# Patient Record
Sex: Female | Born: 1960
Health system: Southern US, Community
[De-identification: ages and names within clinical notes are randomized; demographics above are authoritative.]

## PROBLEM LIST (undated history)

## (undated) DIAGNOSIS — E785 Hyperlipidemia, unspecified: Secondary | ICD-10-CM

## (undated) DIAGNOSIS — Z793 Long term (current) use of hormonal contraceptives: Secondary | ICD-10-CM

## (undated) HISTORY — DX: Hyperlipidemia, unspecified: E78.5

## (undated) HISTORY — PX: CHOLECYSTECTOMY: SHX55

## (undated) HISTORY — DX: Long term (current) use of hormonal contraceptives: Z79.3

---

## 1999-03-19 ENCOUNTER — Encounter: Payer: Self-pay | Admitting: Emergency Medicine

## 1999-03-19 ENCOUNTER — Emergency Department (HOSPITAL_COMMUNITY): Admission: EM | Admit: 1999-03-19 | Discharge: 1999-03-19 | Payer: Self-pay | Admitting: Emergency Medicine

## 1999-04-01 ENCOUNTER — Ambulatory Visit (HOSPITAL_COMMUNITY): Admission: RE | Admit: 1999-04-01 | Discharge: 1999-04-02 | Payer: Self-pay | Admitting: *Deleted

## 1999-12-30 ENCOUNTER — Encounter: Admission: RE | Admit: 1999-12-30 | Discharge: 1999-12-30 | Payer: Self-pay | Admitting: Family Medicine

## 1999-12-30 ENCOUNTER — Encounter: Payer: Self-pay | Admitting: Family Medicine

## 2000-02-19 ENCOUNTER — Emergency Department (HOSPITAL_COMMUNITY): Admission: EM | Admit: 2000-02-19 | Discharge: 2000-02-20 | Payer: Self-pay | Admitting: Emergency Medicine

## 2000-02-20 ENCOUNTER — Encounter: Payer: Self-pay | Admitting: Emergency Medicine

## 2000-06-16 ENCOUNTER — Ambulatory Visit (HOSPITAL_COMMUNITY): Admission: RE | Admit: 2000-06-16 | Discharge: 2000-06-16 | Payer: Self-pay | Admitting: Gastroenterology

## 2000-06-16 ENCOUNTER — Encounter: Payer: Self-pay | Admitting: Gastroenterology

## 2000-07-14 ENCOUNTER — Ambulatory Visit (HOSPITAL_COMMUNITY): Admission: RE | Admit: 2000-07-14 | Discharge: 2000-07-14 | Payer: Self-pay | Admitting: Gastroenterology

## 2000-07-20 ENCOUNTER — Ambulatory Visit (HOSPITAL_COMMUNITY): Admission: RE | Admit: 2000-07-20 | Discharge: 2000-07-20 | Payer: Self-pay | Admitting: Gastroenterology

## 2003-03-05 ENCOUNTER — Encounter: Admission: RE | Admit: 2003-03-05 | Discharge: 2003-03-05 | Payer: Self-pay | Admitting: Family Medicine

## 2003-03-09 ENCOUNTER — Ambulatory Visit: Admission: RE | Admit: 2003-03-09 | Discharge: 2003-03-09 | Payer: Self-pay | Admitting: Family Medicine

## 2005-02-27 IMAGING — CR DG HIP (WITH OR WITHOUT PELVIS) 2-3V*L*
3 series · 3 of 3 positions shown · non-contrast
Comparison: none

CLINICAL DATA: Left hip pain, no injury.
 LEFT HIP, COMPLETE
 AP and lateral view of the left hip and an AP view of the pelvis show no evidence of fracture, dislocation, arthritis, or other abnormality associated with the left hip or the soft tissues.
 There is noted transitional vertebral body at the L5 level with somewhat asymmetric false joints at the L5 level.  The sacroiliac joints otherwise appear normal.  There may be a mild lumbar scoliosis.
 IMPRESSION
 Normal left hip.
 Transitional vertebral body with asymmetrical false joints, L5.

[view not recorded (1 of 3)]
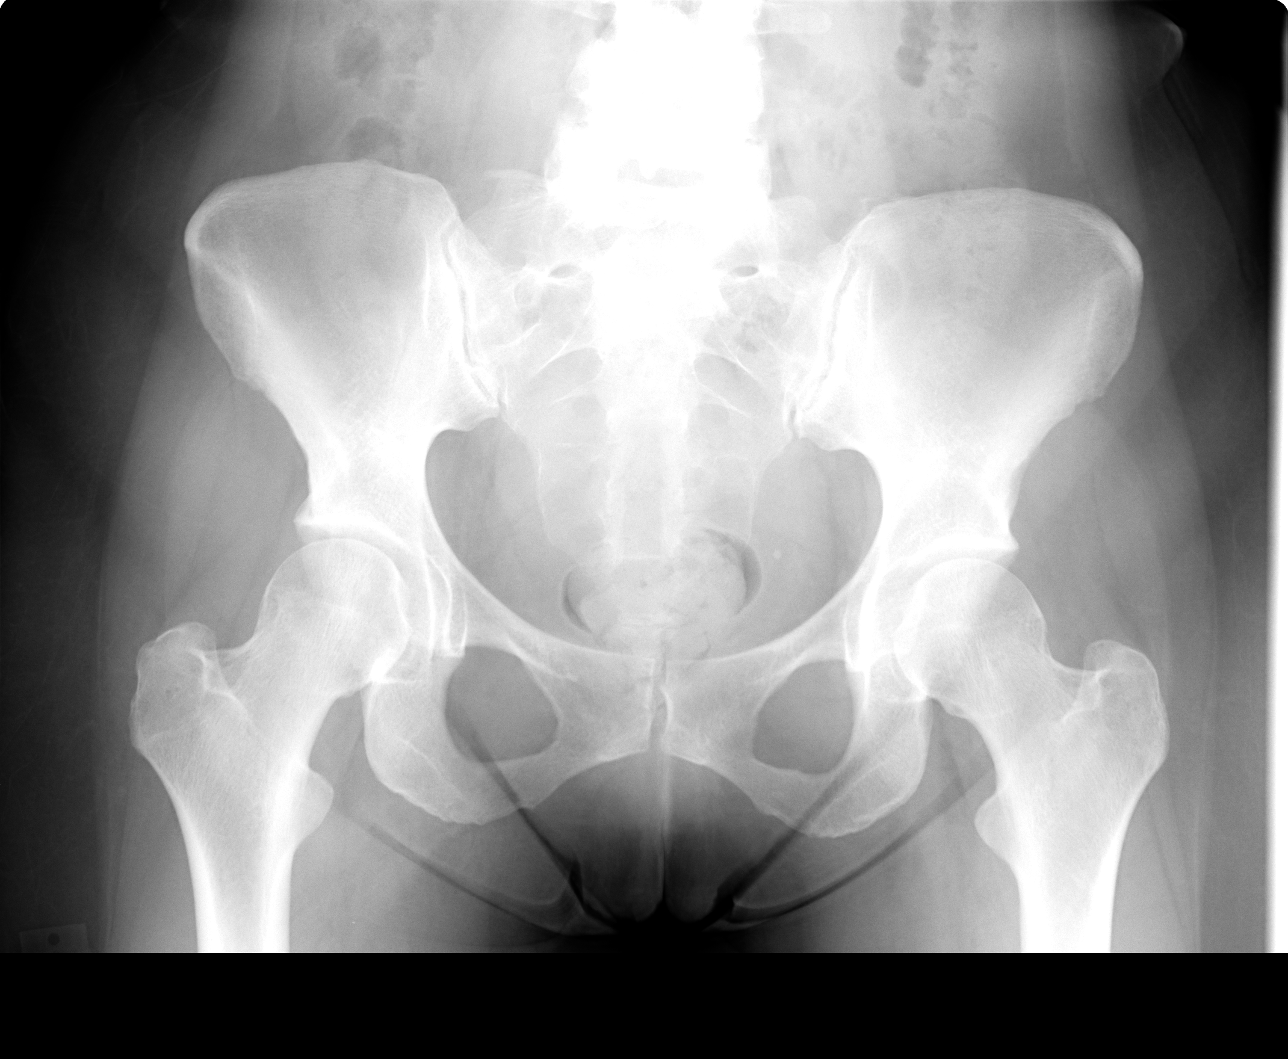

[view not recorded (2 of 3)]
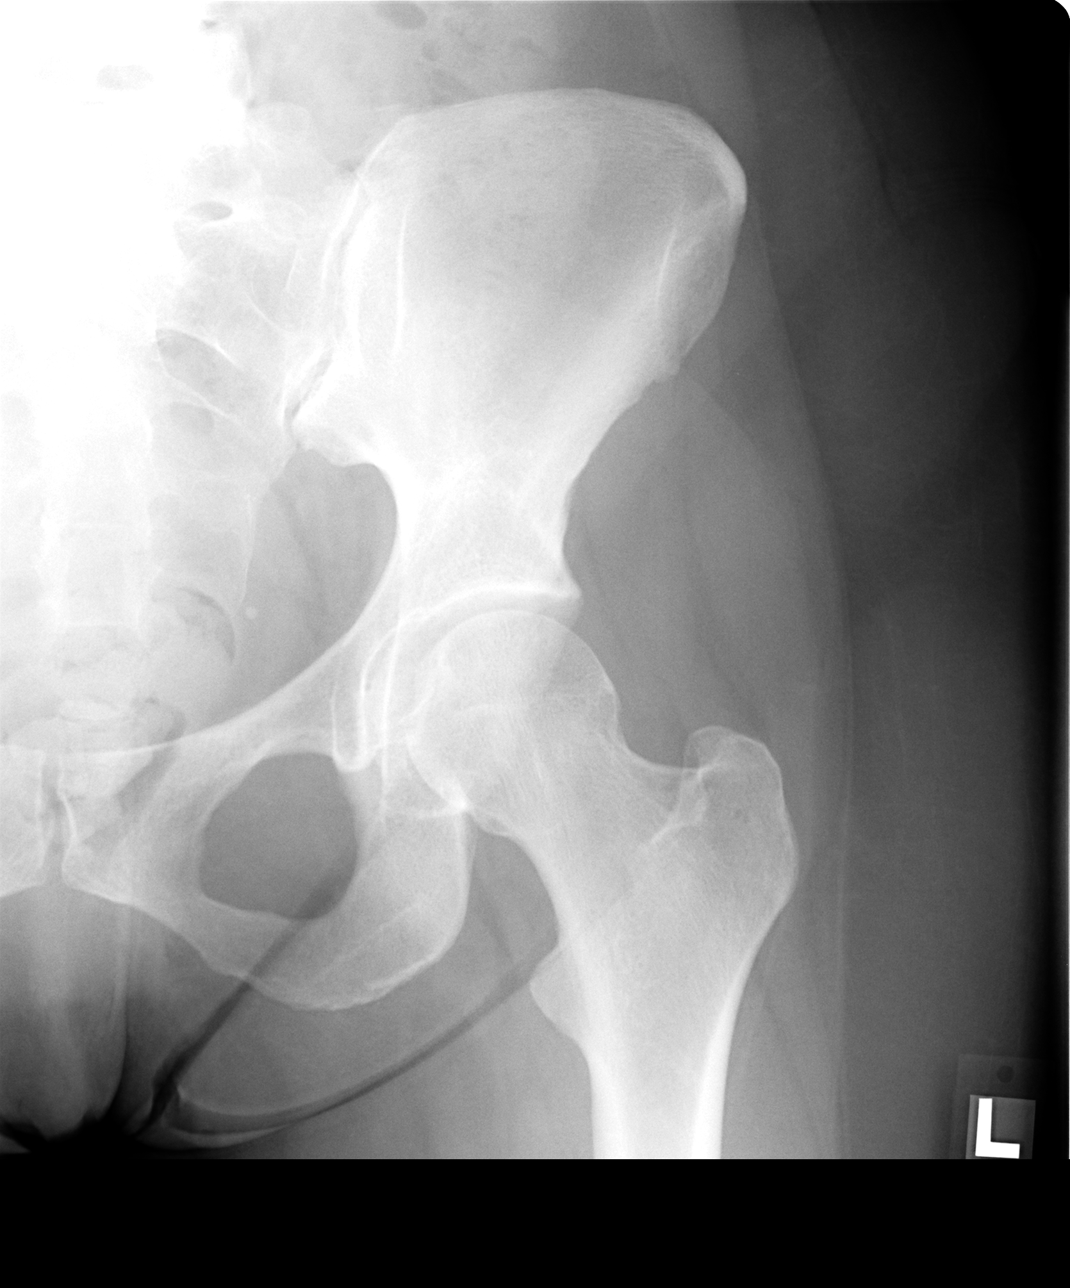

[view not recorded (3 of 3)]
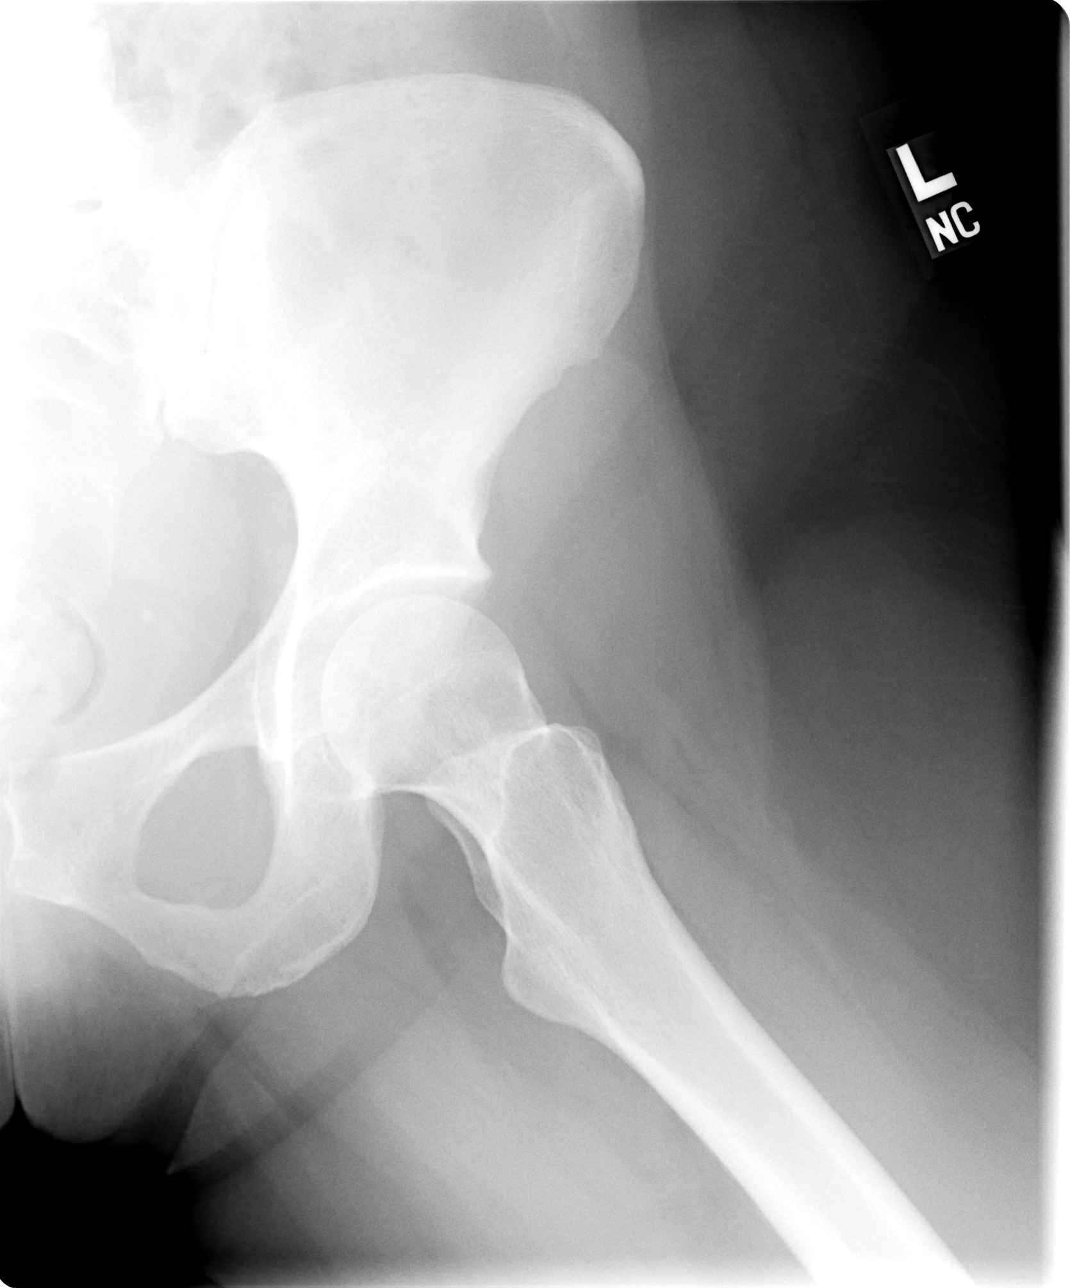

[3 of 3 positions shown; findings below may reference images not displayed]

## 2011-01-02 LAB — HM PAP SMEAR: HM Pap smear: NORMAL

## 2012-03-31 ENCOUNTER — Other Ambulatory Visit (INDEPENDENT_AMBULATORY_CARE_PROVIDER_SITE_OTHER): Payer: BC Managed Care – PPO

## 2012-03-31 DIAGNOSIS — E785 Hyperlipidemia, unspecified: Secondary | ICD-10-CM

## 2012-03-31 DIAGNOSIS — Z79899 Other long term (current) drug therapy: Secondary | ICD-10-CM

## 2012-03-31 LAB — COMPREHENSIVE METABOLIC PANEL
ALT: 10 U/L (ref 0–35)
AST: 15 U/L (ref 0–37)
Albumin: 4 g/dL (ref 3.5–5.2)
Alkaline Phosphatase: 63 U/L (ref 39–117)
BUN: 17 mg/dL (ref 6–23)
CO2: 26 mEq/L (ref 19–32)
Calcium: 9.6 mg/dL (ref 8.4–10.5)
Chloride: 106 mEq/L (ref 96–112)
Creat: 0.74 mg/dL (ref 0.50–1.10)
Glucose, Bld: 86 mg/dL (ref 70–99)
Potassium: 4.8 mEq/L (ref 3.5–5.3)
Sodium: 144 mEq/L (ref 135–145)
Total Bilirubin: 0.4 mg/dL (ref 0.3–1.2)
Total Protein: 6.7 g/dL (ref 6.0–8.3)

## 2012-03-31 LAB — LIPID PANEL
Cholesterol: 127 mg/dL (ref 0–200)
HDL: 53 mg/dL (ref >39)
LDL Cholesterol: 49 mg/dL (ref 0–99)
Total CHOL/HDL Ratio: 2.4 Ratio
Triglycerides: 127 mg/dL (ref <150)
VLDL: 25 mg/dL (ref 0–40)

## 2012-04-04 ENCOUNTER — Other Ambulatory Visit: Payer: Self-pay | Admitting: Family Medicine

## 2012-04-04 DIAGNOSIS — E785 Hyperlipidemia, unspecified: Secondary | ICD-10-CM

## 2012-04-04 MED ORDER — SIMVASTATIN 20 MG PO TABS: 20.0000 mg | ORAL_TABLET | Freq: Every day | ORAL | Status: DC

## 2012-04-04 NOTE — Telephone Encounter (Signed)
Patient called about lab results.  Told all normal.  Continue all medications no changes.Medication refilled per protocol.

## 2012-08-19 ENCOUNTER — Telehealth: Payer: Self-pay | Admitting: Physician Assistant

## 2012-08-19 DIAGNOSIS — E785 Hyperlipidemia, unspecified: Secondary | ICD-10-CM

## 2012-08-19 MED ORDER — SIMVASTATIN 20 MG PO TABS: 20.0000 mg | ORAL_TABLET | Freq: Every day | ORAL | Status: DC

## 2012-08-19 MED ORDER — NORGESTIMATE-ETH ESTRADIOL 0.25-35 MG-MCG PO TABS: 1.0000 | ORAL_TABLET | Freq: Every day | ORAL | Status: DC

## 2012-08-19 NOTE — Telephone Encounter (Signed)
Medication refilled per protocol. 

## 2012-11-30 LAB — HM MAMMOGRAPHY: HM Mammogram: NORMAL

## 2013-01-30 ENCOUNTER — Encounter: Payer: Self-pay | Admitting: Family Medicine

## 2013-11-28 ENCOUNTER — Telehealth: Payer: Self-pay | Admitting: Physician Assistant

## 2013-12-05 ENCOUNTER — Other Ambulatory Visit: Payer: BC Managed Care – PPO

## 2013-12-05 ENCOUNTER — Encounter: Payer: Self-pay | Admitting: Family Medicine

## 2013-12-05 DIAGNOSIS — Z79899 Other long term (current) drug therapy: Secondary | ICD-10-CM

## 2013-12-05 DIAGNOSIS — Z793 Long term (current) use of hormonal contraceptives: Secondary | ICD-10-CM

## 2013-12-05 DIAGNOSIS — Z0000 Encounter for general adult medical examination without abnormal findings: Secondary | ICD-10-CM

## 2013-12-05 DIAGNOSIS — E785 Hyperlipidemia, unspecified: Secondary | ICD-10-CM | POA: Insufficient documentation

## 2013-12-05 LAB — CBC WITH DIFFERENTIAL/PLATELET
Basophils Absolute: 0 10*3/uL (ref 0.0–0.1)
Basophils Relative: 0 % (ref 0–1)
Eosinophils Absolute: 0.1 10*3/uL (ref 0.0–0.7)
Eosinophils Relative: 1 % (ref 0–5)
HCT: 42.5 % (ref 36.0–46.0)
Hemoglobin: 14.6 g/dL (ref 12.0–15.0)
Lymphocytes Relative: 35 % (ref 12–46)
Lymphs Abs: 1.8 10*3/uL (ref 0.7–4.0)
MCH: 31.3 pg (ref 26.0–34.0)
MCHC: 34.4 g/dL (ref 30.0–36.0)
MCV: 91 fL (ref 78.0–100.0)
MPV: 10.2 fL (ref 9.4–12.4)
Monocytes Absolute: 0.5 10*3/uL (ref 0.1–1.0)
Monocytes Relative: 9 % (ref 3–12)
Neutro Abs: 2.9 10*3/uL (ref 1.7–7.7)
Neutrophils Relative %: 55 % (ref 43–77)
Platelets: 266 10*3/uL (ref 150–400)
RBC: 4.67 MIL/uL (ref 3.87–5.11)
RDW: 13 % (ref 11.5–15.5)
WBC: 5.2 10*3/uL (ref 4.0–10.5)

## 2013-12-05 LAB — COMPLETE METABOLIC PANEL WITH GFR
ALT: 8 U/L (ref 0–35)
AST: 14 U/L (ref 0–37)
Albumin: 4.1 g/dL (ref 3.5–5.2)
Alkaline Phosphatase: 70 U/L (ref 39–117)
BUN: 15 mg/dL (ref 6–23)
CO2: 25 mEq/L (ref 19–32)
Calcium: 9.1 mg/dL (ref 8.4–10.5)
Chloride: 106 mEq/L (ref 96–112)
Creat: 0.67 mg/dL (ref 0.50–1.10)
GFR, Est African American: >89 mL/min
GFR, Est Non African American: >89 mL/min
Glucose, Bld: 82 mg/dL (ref 70–99)
Potassium: 4.4 mEq/L (ref 3.5–5.3)
Sodium: 140 mEq/L (ref 135–145)
Total Bilirubin: 0.6 mg/dL (ref 0.2–1.2)
Total Protein: 7 g/dL (ref 6.0–8.3)

## 2013-12-05 LAB — LIPID PANEL
Cholesterol: 186 mg/dL (ref 0–200)
HDL: 48 mg/dL (ref >39)
LDL Cholesterol: 112 mg/dL — ABNORMAL HIGH (ref 0–99)
Total CHOL/HDL Ratio: 3.9 Ratio
Triglycerides: 130 mg/dL (ref <150)
VLDL: 26 mg/dL (ref 0–40)

## 2013-12-05 LAB — TSH: TSH: 1.671 u[IU]/mL (ref 0.350–4.500)

## 2013-12-06 LAB — VITAMIN D 25 HYDROXY (VIT D DEFICIENCY, FRACTURES): Vit D, 25-Hydroxy: 41 ng/mL (ref 30–100)

## 2013-12-14 ENCOUNTER — Encounter: Payer: Self-pay | Admitting: Physician Assistant

## 2013-12-14 ENCOUNTER — Ambulatory Visit (INDEPENDENT_AMBULATORY_CARE_PROVIDER_SITE_OTHER): Payer: BC Managed Care – PPO | Admitting: Physician Assistant

## 2013-12-14 VITALS — BP 126/80 | HR 72 | Temp 97.9°F | Resp 18 | Ht 61.5 in | Wt 167.0 lb

## 2013-12-14 DIAGNOSIS — Z0000 Encounter for general adult medical examination without abnormal findings: Secondary | ICD-10-CM

## 2013-12-14 NOTE — Progress Notes (Signed)
Patient ID: Pamela Moss MRN: [id number], DOB: 08/03/1960, 53 y.o. Date of Encounter: 12/14/2013,   Chief Complaint: Physical (CPE)  HPI: 53 y.o. y/o white female  here for CPE.   Her last office visit in this office was for a complete physical exam with me on 01/15/2012. Until that time she had been taking simvastatin for hyperlipidemia and oral contraceptives because of history of irregular menses.  Says that these medications were refilled at the time of that visit but then since those refills ran out, she has been out of these medications since then. Says that since being off of the birth control pills, she is still having regular menses.  She has no complaints or concerns today.   Review of Systems: Consitutional: No fever, chills, fatigue, night sweats, lymphadenopathy. No significant/unexplained weight changes. Eyes: No visual changes, eye redness, or discharge. ENT/Mouth: No ear pain, sore throat, nasal drainage, or sinus pain. Cardiovascular: No chest pressure,heaviness, tightness or squeezing, even with exertion. No increased shortness of breath or dyspnea on exertion.No palpitations, edema, orthopnea, PND. Respiratory: No cough, hemoptysis, SOB, or wheezing. Gastrointestinal: No anorexia, dysphagia, reflux, pain, nausea, vomiting, hematemesis, diarrhea, constipation, BRBPR, or melena. Breast: No mass, nodules, bulging, or retraction. No skin changes or inflammation. No nipple discharge. No lymphadenopathy. Genitourinary: No dysuria, hematuria, incontinence, vaginal discharge, pruritis, burning, abnormal bleeding, or pain. Musculoskeletal: No decreased ROM, No joint pain or swelling. No significant pain in neck, back, or extremities. Skin: No rash, pruritis, or concerning lesions. Neurological: No headache, dizziness, syncope, seizures, tremors, memory loss, coordination problems, or paresthesias. Psychological: No anxiety, depression, hallucinations, SI/HI. Endocrine: No  polydipsia, polyphagia, polyuria, or known diabetes.No increased fatigue. No palpitations/rapid heart rate. No significant/unexplained weight change. All other systems were reviewed and are otherwise negative.  Past Medical History  Diagnosis Date  . Hyperlipidemia   . On oral contraceptive pills for non-contraception indication      Past Surgical History  Procedure Laterality Date  . Cholecystectomy      Home Meds:  Outpatient Prescriptions Prior to Visit  Medication Sig Dispense Refill  . norgestimate-ethinyl estradiol (ORTHO-CYCLEN,SPRINTEC,PREVIFEM) 0.25-35 MG-MCG tablet Take 1 tablet by mouth daily. (Patient not taking: Reported on 12/14/2013) 3 Package 1  . simvastatin (ZOCOR) 20 MG tablet Take 1 tablet (20 mg total) by mouth at bedtime. (Patient not taking: Reported on 12/14/2013) 90 tablet 1   No facility-administered medications prior to visit.    Allergies:  Allergies  Allergen Reactions  . Codeine     History   Social History  . Marital Status: Legally Separated    Spouse Name: N/A    Number of Children: N/A  . Years of Education: N/A   Occupational History  . Not on file.   Social History Main Topics  . Smoking status: Never Smoker   . Smokeless tobacco: Never Used  . Alcohol Use: No  . Drug Use: No  . Sexual Activity: Yes    Birth Control/ Protection: Pill   Other Topics Concern  . Not on file   Social History Narrative    She works at [employer]. She has one child a son who is 53 years old. At the time of her physical with me January 2014 she was walking 4 miles 4 nights per week. So reported she had was careful with her diet.  Today she says that her job is now requiring more travel. Therefore she is only able to do her walking about 2 days a  week when she is in town and not traveling. She also says that she feels that her diet is probably a little worse because of having to eat and airports and because of travel. However does feel that she  does still eat healthy when she is in town.  Family History  Problem Relation Age of Onset  . COPD Mother   . Hyperlipidemia Mother   . Hypertension Mother   . Heart disease Father     MI @ 370, stents @72   . Hyperlipidemia Father   . Hypertension Father     Physical Exam: Blood pressure 126/80, pulse 72, temperature 97.9 F (36.6 C), temperature source Oral, resp. rate 18, height 5' 1.5" (1.562 m), weight 167 lb (75.751 kg), last menstrual period 11/21/2013., Body mass index is 31.05 kg/(m^2). General: Well developed, well nourished,WF. Appears in no acute distress. HEENT: Normocephalic, atraumatic. Conjunctiva pink, sclera non-icteric. Pupils 2 mm constricting to 1 mm, round, regular, and equally reactive to light and accomodation. EOMI. Internal auditory canal clear. TMs with good cone of light and without pathology. Nasal mucosa pink. Nares are without discharge. No sinus tenderness. Oral mucosa pink.  Pharynx without exudate.   Neck: Supple. Trachea midline. No thyromegaly. Full ROM. No lymphadenopathy.No Carotid Bruits. Lungs: Clear to auscultation bilaterally without wheezes, rales, or rhonchi. Breathing is of normal effort and unlabored. Cardiovascular: RRR with S1 S2. No murmurs, rubs, or gallops. Distal pulses 2+ symmetrically. No carotid or abdominal bruits. Breast: Symmetrical. No masses. Nipples without discharge. Abdomen: Soft, non-tender, non-distended with normoactive bowel sounds. No hepatosplenomegaly or masses. No rebound/guarding. No CVA tenderness. No hernias.  Genitourinary:  External genitalia without lesions. Vaginal mucosa pink.No discharge present. Cervix pink and without discharge. No cervical tenderness.Normal uterus size. No adnexal mass or tenderness.  Pap smear taken. Musculoskeletal: Full range of motion and 5/5 strength throughout. Without swelling, atrophy, tenderness, crepitus, or warmth. Extremities without clubbing, cyanosis, or edema. Calves supple. Skin:  Warm and moist without erythema, ecchymosis, wounds, or rash. Neuro: A+Ox3. CN II-XII grossly intact. Moves all extremities spontaneously. Full sensation throughout. Normal gait. DTR 2+ throughout upper and lower extremities. Finger to nose intact. Psych:  Responds to questions appropriately with a normal affect.   Assessment/Plan:  53 y.o. y/o female here for CPE 1. Visit for preventive health examination   - Ambulatory referral to Gastroenterology - PAP, Thin Prep w/HPV rflx HPV Type 16/18   A. Screening Labs: Came on 12/05/13 and had fasting labs. CBC normal CMP T normal TSH normal Vitamin D level is normal on current dose of vitamin D. Continue current dose of vitamin D. Lipid panel was excellent. Triglycerides and HDL were good and LDL 112.          He had been off of lipid medicine for a long time prior to this lab draw. She can remain off of lipid therapy.  B. Pap: Last Pap smear was performed here 01/02/2011. Cytology and HPV were all negative.  Repeat Pap smear today. If this one is normal then can wait 3-5 years to repeat.   C. Screening Mammogram: Asked mammogram 12/2012.  Patient plans to schedule follow-up mammogram once it has been a full 12 months   D. DEXA/BMD:  Can wait" closer to age 53. She is still having regular menses and is not menopausal.   E. Colorectal Cancer Screening: She has never had screening colonoscopy. She is agreeable for me to do referral to GI to have this performed.   F. Immunizations:  Influenza: She defers influenza vaccine  Tetanus:Tetanus is overdue but she defers this.  Pneumococcal:She has no indication to needing pneumonia vaccine until age 71  Zostavax: We'll discuss at age 72 once this is indicated  Wickham Hodgkins [place], [place], [facility] 12/14/2013 2:32 PM

## 2013-12-15 LAB — PAP, THIN PREP W/HPV RFLX HPV TYPE 16/18: HPV DNA High Risk: NOT DETECTED

## 2013-12-19 ENCOUNTER — Encounter: Payer: Self-pay | Admitting: Family Medicine

## 2014-04-02 ENCOUNTER — Encounter: Payer: Self-pay | Admitting: Family Medicine

## 2015-01-02 ENCOUNTER — Ambulatory Visit (INDEPENDENT_AMBULATORY_CARE_PROVIDER_SITE_OTHER): Payer: BLUE CROSS/BLUE SHIELD | Admitting: Physician Assistant

## 2015-01-02 ENCOUNTER — Encounter: Payer: Self-pay | Admitting: Physician Assistant

## 2015-01-02 VITALS — BP 122/80 | HR 76 | Temp 98.4°F | Resp 18 | Wt 161.0 lb

## 2015-01-02 DIAGNOSIS — J988 Other specified respiratory disorders: Secondary | ICD-10-CM

## 2015-01-02 DIAGNOSIS — B9689 Other specified bacterial agents as the cause of diseases classified elsewhere: Secondary | ICD-10-CM

## 2015-01-02 MED ORDER — AMOXICILLIN-POT CLAVULANATE 875-125 MG PO TABS: 1.0000 | ORAL_TABLET | Freq: Two times a day (BID) | ORAL | Status: DC

## 2015-01-02 MED ORDER — AMOXICILLIN-POT CLAVULANATE 600-42.9 MG/5ML PO SUSR: ORAL | Status: DC

## 2015-01-02 NOTE — Progress Notes (Signed)
    Patient ID: Pamela Moss MRN: [id number], DOB: 06/09/1960, 54 y.o. Date of Encounter: 01/02/2015, 2:48 PM    Chief Complaint:  Chief Complaint  Patient presents with  . c/o sinus infection    x 1 week     HPI: 54 y.o. year old white female says all of her congestion is in her nasal region. Says that she has felt a lot of congestion there and does occasionally get out mucus. Says that she has had no chest congestion or cough. No sore throat no ear ache no fevers or chills. Says she uses over-the-counter decongestants and that helps the symptoms for about 4 hours and it wears off and she is to take the medicine again.     Home Meds:   Outpatient Prescriptions Prior to Visit  Medication Sig Dispense Refill  . cholecalciferol (VITAMIN D) 1000 UNITS tablet Take 1,000 Units by mouth daily.     No facility-administered medications prior to visit.    Allergies:  Allergies  Allergen Reactions  . Codeine       Review of Systems: See HPI for pertinent ROS. All other ROS negative.    Physical Exam: Blood pressure 122/80, pulse 76, temperature 98.4 F (36.9 C), temperature source Oral, resp. rate 18, weight 161 lb (73.029 kg)., Body mass index is 29.93 kg/(m^2). General:  WNWD WF. Appears in no acute distress. HEENT: Normocephalic, atraumatic, eyes without discharge, sclera non-icteric, nares are without discharge. Bilateral auditory canals clear, TM's are without perforation, pearly grey and translucent with reflective cone of light bilaterally. Oral cavity moist, posterior pharynx without exudate, erythema, peritonsillar abscess. Minimal tenderness with percussion to left maxillary sinus. No tenderness with percussion of right maxillary sinus or bilateral frontal sinuses.  Neck: Supple. No thyromegaly. No lymphadenopathy. Lungs: Clear bilaterally to auscultation without wheezes, rales, or rhonchi. Breathing is unlabored. Heart: Regular rhythm. No murmurs, rubs, or  gallops. Msk:  Strength and tone normal for age. Extremities/Skin: Warm and dry. Neuro: Alert and oriented X 3. Moves all extremities spontaneously. Gait is normal. CNII-XII grossly in tact. Psych:  Responds to questions appropriately with a normal affect.     ASSESSMENT AND PLAN:  54 y.o. year old female with  1. Bacterial respiratory infection She states that she needs liquid cannot swallow pills and capsules. Take Augmentin as directed. Follow-up if symptoms do not resolve upon completion of antibiotic. - amoxicillin-clavulanate (AUGMENTIN ES-600) 600-42.9 MG/5ML suspension; 7.5 ml twice a day for 7 days  Dispense: 125 mL; Refill: 0   Signed, [address] AvenueMary Beth DorneyvilleDixon, [place], [facility] 01/02/2015 2:48 PM

## 2015-11-13 LAB — HM MAMMOGRAPHY

## 2015-11-14 ENCOUNTER — Encounter: Payer: Self-pay | Admitting: Family Medicine

## 2017-01-06 LAB — HM MAMMOGRAPHY

## 2017-02-01 ENCOUNTER — Other Ambulatory Visit: Payer: Self-pay

## 2017-02-01 ENCOUNTER — Encounter: Payer: Self-pay | Admitting: Family Medicine

## 2017-02-01 ENCOUNTER — Ambulatory Visit (INDEPENDENT_AMBULATORY_CARE_PROVIDER_SITE_OTHER): Payer: BLUE CROSS/BLUE SHIELD | Admitting: Family Medicine

## 2017-02-01 VITALS — BP 126/68 | HR 76 | Temp 98.0°F | Resp 16 | Ht 62.0 in | Wt 170.0 lb

## 2017-02-01 DIAGNOSIS — R21 Rash and other nonspecific skin eruption: Secondary | ICD-10-CM | POA: Diagnosis not present

## 2017-02-01 MED ORDER — PERMETHRIN 5 % EX CREA: 1.0000 "application " | TOPICAL_CREAM | Freq: Once | CUTANEOUS | 0 refills | Status: AC

## 2017-02-01 MED ORDER — TRIAMCINOLONE ACETONIDE 0.1 % EX CREA: 1.0000 "application " | TOPICAL_CREAM | Freq: Two times a day (BID) | CUTANEOUS | 1 refills | Status: AC

## 2017-02-01 NOTE — Progress Notes (Signed)
   Subjective:    Patient ID: Pamela Moss, female    DOB: 05/09/1960, 57 y.o.   MRN: [id number]  Patient presents for Rash (x3 days- irritation to back of neck, front of neck and down to L breast- smal blister like areas in irritation- reports itching and tingling to the area in back d/t clothes rubbing)  Pt here with rash on neck, down to left breast, itches and tingles some. JUsed Calamine which helped She noticed the rash 3 days ago, when she came [place]. She stayed in a hotel. No other change in soap, deterent, no new contacts to skin otherwise. The rash is not spreading Has not been sick recently, no fever.  No current medications She does not take pills   Review Of Systems:  GEN- denies fatigue, fever, weight loss,weakness, recent illness HEENT- denies eye drainage, change in vision, nasal discharge, CVS- denies chest pain, palpitations RESP- denies SOB, cough, wheeze ABD- denies N/V, change in stools, abd pain GU- denies dysuria, hematuria, dribbling, incontinence MSK- denies joint pain, muscle aches, injury Neuro- denies headache, dizziness, syncope, seizure activity       Objective:    BP 126/68   Pulse 76   Temp 98 F (36.7 C) (Oral)   Resp 16   Ht 5\' 2"  (1.575 m)   Wt 170 lb (77.1 kg)   SpO2 98%   BMI 31.09 kg/m  GEN- NAD, alert and oriented x3 HEENT- PERRL, EOMI, non injected sclera, pink conjunctiva, MMM, oropharynx clear Neck- Supple, no lad CVS- RRR, no murmur resp-CTAB Skin- erythematous maculopapular patch anterior left side of neck, few single lesions midway down left breast/chest wall, lesions in scalp to midline, TTP, few tiny blisters in patch on post neck  Pulses- Radial  2+        Assessment & Plan:      Problem List Items Addressed This Visit    None    Visit Diagnoses    Rash and nonspecific skin eruption    -  Primary   Rash crosses multiple dermatomes, up to occpiratal region in middle of head? Some  blistering mostly maculopailar leions. ? Scabies/mites recent hotel, considered shingles but not classic. Contact dermatitis of some soft  No new lesions, intense itching. Treat with premethrin as in scalp as well, discused oral steroids or IM she declined both, has fear of the steroids and does not want pills wILL GIVE topical Triamcinolone, if not improved advised to call me back       Note: This dictation was prepared with Dragon dictation along with smaller phrase technology. Any transcriptional errors that result from this process are unintentional.

## 2017-02-01 NOTE — Patient Instructions (Signed)
Use Premethrin cream as prescribed, rinse in the morning Use topical steroid cream  Call if not improved

## 2018-01-31 LAB — HM MAMMOGRAPHY

## 2018-02-03 ENCOUNTER — Encounter: Payer: Self-pay | Admitting: *Deleted

## 2022-04-22 LAB — HM MAMMOGRAPHY
